# Patient Record
Sex: Male | Born: 1955 | Race: Black or African American | Hispanic: No | Marital: Single | Smoking: Never smoker
Health system: Southern US, Community
[De-identification: ages and names within clinical notes are randomized; demographics above are authoritative.]

## PROBLEM LIST (undated history)

## (undated) DIAGNOSIS — I10 Essential (primary) hypertension: Secondary | ICD-10-CM

---

## 2019-06-16 ENCOUNTER — Emergency Department (HOSPITAL_COMMUNITY)
Admission: EM | Admit: 2019-06-16 | Discharge: 2019-06-16 | Disposition: A | Discharge status: Home / Self Care | Payer: Medicare PPO | Attending: Emergency Medicine | Admitting: Emergency Medicine

## 2019-06-16 ENCOUNTER — Other Ambulatory Visit: Payer: Self-pay

## 2019-06-16 ENCOUNTER — Encounter (HOSPITAL_COMMUNITY): Payer: Self-pay | Admitting: Emergency Medicine

## 2019-06-16 DIAGNOSIS — R040 Epistaxis: Secondary | ICD-10-CM

## 2019-06-16 DIAGNOSIS — I10 Essential (primary) hypertension: Secondary | ICD-10-CM | POA: Diagnosis not present

## 2019-06-16 HISTORY — DX: Essential (primary) hypertension: I10

## 2019-06-16 LAB — CBC WITH DIFFERENTIAL/PLATELET
Abs Immature Granulocytes: 0.02 10*3/uL (ref 0.00–0.07)
Basophils Absolute: 0 10*3/uL (ref 0.0–0.1)
Basophils Relative: 1 %
Eosinophils Absolute: 0.2 10*3/uL (ref 0.0–0.5)
Eosinophils Relative: 3 %
HCT: 46.8 % (ref 39.0–52.0)
Hemoglobin: 15.7 g/dL (ref 13.0–17.0)
Immature Granulocytes: 0 %
Lymphocytes Relative: 28 %
Lymphs Abs: 1.7 10*3/uL (ref 0.7–4.0)
MCH: 31.3 pg (ref 26.0–34.0)
MCHC: 33.5 g/dL (ref 30.0–36.0)
MCV: 93.2 fL (ref 80.0–100.0)
Monocytes Absolute: 0.9 10*3/uL (ref 0.1–1.0)
Monocytes Relative: 14 %
Neutro Abs: 3.3 10*3/uL (ref 1.7–7.7)
Neutrophils Relative %: 54 %
Platelets: 154 10*3/uL (ref 150–400)
RBC: 5.02 MIL/uL (ref 4.22–5.81)
RDW: 13.2 % (ref 11.5–15.5)
WBC: 6.1 10*3/uL (ref 4.0–10.5)
nRBC: 0 % (ref 0.0–0.2)

## 2019-06-16 LAB — BASIC METABOLIC PANEL
Anion gap: 9 (ref 5–15)
BUN: 22 mg/dL (ref 8–23)
CO2: 21 mmol/L — ABNORMAL LOW (ref 22–32)
Calcium: 9.1 mg/dL (ref 8.9–10.3)
Chloride: 108 mmol/L (ref 98–111)
Creatinine, Ser: 1.14 mg/dL (ref 0.61–1.24)
GFR calc Af Amer: >60 mL/min (ref >60)
GFR calc non Af Amer: >60 mL/min (ref >60)
Glucose, Bld: 115 mg/dL — ABNORMAL HIGH (ref 70–99)
Potassium: 4.9 mmol/L (ref 3.5–5.1)
Sodium: 138 mmol/L (ref 135–145)

## 2019-06-16 LAB — PROTIME-INR
INR: 1.1 (ref 0.8–1.2)
Prothrombin Time: 14.5 seconds (ref 11.4–15.2)

## 2019-06-16 MED ORDER — SILVER NITRATE-POT NITRATE 75-25 % EX MISC
1.0000 "application " | Freq: Once | CUTANEOUS | Status: AC
  Administered 2019-06-16: 1 via TOPICAL
  Filled 2019-06-16: qty 1

## 2019-06-16 MED ORDER — CEPHALEXIN 500 MG PO CAPS: 500.0000 mg | ORAL_CAPSULE | Freq: Four times a day (QID) | ORAL | 0 refills | Status: AC

## 2019-06-16 MED ORDER — LIDOCAINE VISCOUS HCL 2 % MT SOLN
15.0000 mL | Freq: Once | OROMUCOSAL | Status: AC
  Administered 2019-06-16: 15 mL via OROMUCOSAL
  Filled 2019-06-16: qty 15

## 2019-06-16 MED ORDER — LIDOCAINE HCL (CARDIAC) PF 100 MG/5ML IV SOSY
PREFILLED_SYRINGE | INTRAVENOUS | Status: AC
  Filled 2019-06-16: qty 5

## 2019-06-16 MED ORDER — HYDROCODONE-ACETAMINOPHEN 5-325 MG PO TABS: 1.0000 | ORAL_TABLET | ORAL | 0 refills | Status: AC | PRN

## 2019-06-16 MED ORDER — LIDOCAINE HCL (PF) 1 % IJ SOLN
INTRAMUSCULAR | Status: AC
  Filled 2019-06-16: qty 5

## 2019-06-16 MED ORDER — SALINE SPRAY 0.65 % NA SOLN
1.0000 | Freq: Once | NASAL | Status: AC
  Administered 2019-06-16: 1 via NASAL
  Filled 2019-06-16: qty 44

## 2019-06-16 MED ORDER — OXYMETAZOLINE HCL 0.05 % NA SOLN
1.0000 | Freq: Once | NASAL | Status: AC
  Administered 2019-06-16: 1 via NASAL
  Filled 2019-06-16: qty 30

## 2019-06-16 NOTE — ED Provider Notes (Addendum)
[facility] EMERGENCY DEPARTMENT Provider Note   CSN: [id number] Arrival date & time: 06/16/19  1048     History   Chief Complaint Chief Complaint  Patient presents with  . Epistaxis    HPI Charles Lane is a 63 y.o. male.     Pt presents to the ED today with bleeding out of his right nostril.  The pt said he woke up from sleep with the bleeding.  He has never had this happen in the past.  The pt said he is not on blood thinners, just a baby ASA.  He denies any trauma.  He initially went to [facility] who sent him here.     Past Medical History:  Diagnosis Date  . Hypertension     There are no active problems to display for this patient.      Home Medications    Prior to Admission medications   Medication Sig Start Date End Date Taking? Authorizing Provider  cephALEXin (KEFLEX) 500 MG capsule Take 1 capsule (500 mg total) by mouth 4 (four) times daily. 06/16/19   Isla Pence, MD  HYDROcodone-acetaminophen (NORCO/VICODIN) 5-325 MG tablet Take 1 tablet by mouth every 4 (four) hours as needed. 06/16/19   Isla Pence, MD    Family History No family history on file.  Social History Social History   Tobacco Use  . Smoking status: Never Smoker  . Smokeless tobacco: Never Used  Substance Use Topics  . Alcohol use: Not Currently  . Drug use: Never     Allergies   Patient has no known allergies.   Review of Systems Review of Systems  HENT: Positive for nosebleeds.   All other systems reviewed and are negative.    Physical Exam Updated Vital Signs BP 118/90   Pulse 83   Temp 97.6 F (36.4 C) (Axillary)   Resp 17   SpO2 97%   Physical Exam Vitals signs and nursing note reviewed.  Constitutional:      Appearance: Normal appearance.  HENT:     Head: Normocephalic and atraumatic.     Right Ear: External ear normal.     Left Ear: External ear normal.     Nose:     Right Nostril: Epistaxis present.     Mouth/Throat:     Mouth:  Mucous membranes are moist.     Pharynx: Oropharynx is clear.  Eyes:     Extraocular Movements: Extraocular movements intact.     Conjunctiva/sclera: Conjunctivae normal.     Pupils: Pupils are equal, round, and reactive to light.  Neck:     Musculoskeletal: Normal range of motion and neck supple.  Cardiovascular:     Rate and Rhythm: Normal rate and regular rhythm.     Pulses: Normal pulses.     Heart sounds: Normal heart sounds.  Pulmonary:     Effort: Pulmonary effort is normal.     Breath sounds: Normal breath sounds.  Abdominal:     General: Abdomen is flat. Bowel sounds are normal.     Palpations: Abdomen is soft.  Musculoskeletal: Normal range of motion.  Skin:    General: Skin is warm.     Capillary Refill: Capillary refill takes less than 2 seconds.  Neurological:     General: No focal deficit present.     Mental Status: He is alert and oriented to person, place, and time.  Psychiatric:        Mood and Affect: Mood normal.  Behavior: Behavior normal.      ED Treatments / Results  Labs (all labs ordered are listed, but only abnormal results are displayed) Labs Reviewed  BASIC METABOLIC PANEL - Abnormal; Notable for the following components:      Result Value   CO2 21 (*)    Glucose, Bld 115 (*)    All other components within normal limits  CBC WITH DIFFERENTIAL/PLATELET  PROTIME-INR    EKG None  Radiology No results found.  Procedures .Epistaxis Management  Date/Time: 06/16/2019 11:50 AM Performed by: Jacalyn LefevreHaviland, Jamison Soward, MD Authorized by: Jacalyn LefevreHaviland, Ricki Clack, MD   Consent:    Consent obtained:  Verbal   Consent given by:  Patient   Risks discussed:  Bleeding   Alternatives discussed:  No treatment Anesthesia (see MAR for exact dosages):    Anesthesia method:  None Procedure details:    Treatment site:  R anterior   Treatment method:  Silver nitrate   Treatment complexity:  Limited   Treatment episode: recurring   Post-procedure details:     Assessment:  No improvement   Patient tolerance of procedure:  Tolerated well, no immediate complications .Epistaxis Management  Date/Time: 06/16/2019 11:50 AM Performed by: Jacalyn LefevreHaviland, Domenick Quebedeaux, MD Authorized by: Jacalyn LefevreHaviland, Donicia Druck, MD   Consent:    Consent obtained:  Verbal   Consent given by:  Patient   Risks discussed:  Bleeding   Alternatives discussed:  No treatment Anesthesia (see MAR for exact dosages):    Anesthesia method:  Topical application   Topical anesthetic:  Lidocaine gel Procedure details:    Treatment site:  R anterior   Treatment method:  Nasal balloon   Treatment complexity:  Limited   Treatment episode: recurring   Post-procedure details:    Assessment:  Bleeding stopped   Patient tolerance of procedure:  Tolerated well, no immediate complications Comments:     7.5 cm rhino rocket with balloon placed   (including critical care time)  Medications Ordered in ED Medications  sodium chloride (OCEAN) 0.65 % nasal spray 1 spray (has no administration in time range)  oxymetazoline (AFRIN) 0.05 % nasal spray 1 spray (1 spray Each Nare Given 06/16/19 1111)  silver nitrate applicators applicator 1 application (1 application Topical Given 06/16/19 1111)  lidocaine (XYLOCAINE) 2 % viscous mouth solution 15 mL (15 mLs Mouth/Throat Given 06/16/19 1118)     Initial Impression / Assessment and Plan / ED Course  I have reviewed the triage vital signs and the nursing notes.  Pertinent labs & imaging results that were available during my care of the patient were reviewed by me and considered in my medical decision making (see chart for details).       Pt is able to ambulate without his nose bleeding again.  He will be d/c home with the rhino rocket in place.  He is to f/u with ENT.  Return if worse.  As pt was walking out, his nose started bleeding again.  1 cc of air placed into balloon and bleeding stopped.  Pt is now stable for d/c.  Final Clinical Impressions(s) / ED  Diagnoses   Final diagnoses:  Epistaxis    ED Discharge Orders         Ordered    cephALEXin (KEFLEX) 500 MG capsule  4 times daily     06/16/19 1242    HYDROcodone-acetaminophen (NORCO/VICODIN) 5-325 MG tablet  Every 4 hours PRN     06/16/19 1242           Marguita Venning,  Raynelle Fanning, MD 06/16/19 1255    Jacalyn Lefevre, MD 06/16/19 1306

## 2019-06-16 NOTE — ED Notes (Signed)
Pt given dc instructions pt verbalizes understanding.  

## 2019-06-16 NOTE — ED Triage Notes (Signed)
Pt here with nose bleed that started abt 630 and it would bleed and stop bleed and stop. Pt takes a low dose aspirin. [facility] and [facility] suggested he come here to be seen,.

## 2020-04-22 ENCOUNTER — Emergency Department (HOSPITAL_COMMUNITY): Payer: Medicare PPO

## 2020-04-22 ENCOUNTER — Encounter (HOSPITAL_COMMUNITY): Payer: Self-pay

## 2020-04-22 ENCOUNTER — Emergency Department (HOSPITAL_COMMUNITY)
Admission: EM | Admit: 2020-04-22 | Discharge: 2020-04-22 | Disposition: A | Discharge status: Home / Self Care | Payer: Medicare PPO | Attending: Emergency Medicine | Admitting: Emergency Medicine

## 2020-04-22 ENCOUNTER — Other Ambulatory Visit: Payer: Self-pay

## 2020-04-22 DIAGNOSIS — R079 Chest pain, unspecified: Secondary | ICD-10-CM | POA: Insufficient documentation

## 2020-04-22 DIAGNOSIS — Y939 Activity, unspecified: Secondary | ICD-10-CM | POA: Insufficient documentation

## 2020-04-22 DIAGNOSIS — M545 Low back pain: Secondary | ICD-10-CM | POA: Diagnosis not present

## 2020-04-22 DIAGNOSIS — Z79899 Other long term (current) drug therapy: Secondary | ICD-10-CM | POA: Diagnosis not present

## 2020-04-22 DIAGNOSIS — Y999 Unspecified external cause status: Secondary | ICD-10-CM | POA: Insufficient documentation

## 2020-04-22 DIAGNOSIS — S39012A Strain of muscle, fascia and tendon of lower back, initial encounter: Secondary | ICD-10-CM

## 2020-04-22 DIAGNOSIS — Y92410 Unspecified street and highway as the place of occurrence of the external cause: Secondary | ICD-10-CM | POA: Insufficient documentation

## 2020-04-22 DIAGNOSIS — R519 Headache, unspecified: Secondary | ICD-10-CM | POA: Diagnosis not present

## 2020-04-22 LAB — BASIC METABOLIC PANEL
Anion gap: 12 (ref 5–15)
BUN: 13 mg/dL (ref 8–23)
CO2: 23 mmol/L (ref 22–32)
Calcium: 9.1 mg/dL (ref 8.9–10.3)
Chloride: 104 mmol/L (ref 98–111)
Creatinine, Ser: 1.17 mg/dL (ref 0.61–1.24)
GFR calc Af Amer: >60 mL/min (ref >60)
GFR calc non Af Amer: >60 mL/min (ref >60)
Glucose, Bld: 92 mg/dL (ref 70–99)
Potassium: 3.8 mmol/L (ref 3.5–5.1)
Sodium: 139 mmol/L (ref 135–145)

## 2020-04-22 LAB — TROPONIN I (HIGH SENSITIVITY): Troponin I (High Sensitivity): 3 ng/L (ref <18)

## 2020-04-22 LAB — CBC
HCT: 49.7 % (ref 39.0–52.0)
Hemoglobin: 16.4 g/dL (ref 13.0–17.0)
MCH: 30.5 pg (ref 26.0–34.0)
MCHC: 33 g/dL (ref 30.0–36.0)
MCV: 92.4 fL (ref 80.0–100.0)
Platelets: 168 10*3/uL (ref 150–400)
RBC: 5.38 MIL/uL (ref 4.22–5.81)
RDW: 13.2 % (ref 11.5–15.5)
WBC: 5.2 10*3/uL (ref 4.0–10.5)
nRBC: 0 % (ref 0.0–0.2)

## 2020-04-22 MED ORDER — CYCLOBENZAPRINE HCL 5 MG PO TABS: 5.0000 mg | ORAL_TABLET | Freq: Three times a day (TID) | ORAL | 0 refills | Status: AC | PRN

## 2020-04-22 MED ORDER — HYDROCODONE-ACETAMINOPHEN 5-325 MG PO TABS
2.0000 | ORAL_TABLET | Freq: Once | ORAL | Status: AC
  Administered 2020-04-22: 2 via ORAL
  Filled 2020-04-22: qty 2
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
## 2020-04-22 NOTE — Discharge Instructions (Signed)
Take motrin 800 mg every 6 hrs for pain   Your x-rays and CAT scans and labs were unremarkable.  I think you likely have some muscle spasms which will take another week or two to improve.  See your doctor for follow-up.  Return to ER if you have worse back pain, unable to walk, chest pain.
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
## 2020-06-13 ENCOUNTER — Other Ambulatory Visit: Payer: Self-pay

## 2020-06-13 ENCOUNTER — Ambulatory Visit: Payer: Medicare PPO | Attending: Family Medicine | Admitting: Physical Therapy

## 2020-06-13 ENCOUNTER — Encounter: Payer: Self-pay | Admitting: Physical Therapy

## 2020-06-13 DIAGNOSIS — G8929 Other chronic pain: Secondary | ICD-10-CM | POA: Insufficient documentation

## 2020-06-13 DIAGNOSIS — M6281 Muscle weakness (generalized): Secondary | ICD-10-CM

## 2020-06-13 DIAGNOSIS — R2689 Other abnormalities of gait and mobility: Secondary | ICD-10-CM | POA: Diagnosis present

## 2020-06-13 DIAGNOSIS — M545 Low back pain, unspecified: Secondary | ICD-10-CM | POA: Diagnosis not present
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
## 2020-06-13 NOTE — Patient Instructions (Signed)
Access Code: [facility] URL: [email or web address] Date: 06/13/2020 Prepared by: Rosana Hoes  Exercises Supine Lower Trunk Rotation - 2 x daily - 5 reps - 10 seconds hold Hooklying Single Knee to Chest Stretch - 2 x daily - 3 reps - 30 seconds hold Quadruped Rocking Backward - 2 x daily - 5 reps - 10 seconds hold Standing Paraspinals Mobilization with Small Ball on Wall - 2 x daily - 2-5 minutes hold

## 2020-06-20 ENCOUNTER — Other Ambulatory Visit: Payer: Self-pay

## 2020-06-20 ENCOUNTER — Encounter: Payer: Self-pay | Admitting: Physical Therapy

## 2020-06-20 ENCOUNTER — Ambulatory Visit: Payer: Medicare PPO | Attending: Family Medicine | Admitting: Physical Therapy

## 2020-06-20 DIAGNOSIS — R2689 Other abnormalities of gait and mobility: Secondary | ICD-10-CM | POA: Diagnosis present

## 2020-06-20 DIAGNOSIS — M6281 Muscle weakness (generalized): Secondary | ICD-10-CM

## 2020-06-20 DIAGNOSIS — M545 Low back pain, unspecified: Secondary | ICD-10-CM | POA: Diagnosis not present

## 2020-06-20 DIAGNOSIS — G8929 Other chronic pain: Secondary | ICD-10-CM | POA: Insufficient documentation

## 2020-06-20 NOTE — Therapy (Signed)
[facility] [address] [place], [place], [address] Phone: [phone]   Fax:  [phone]  Physical Therapy Treatment  Patient Details  Name: Ersel Enslin MRN: [id number] Date of Birth: [birth year] Referring Provider (PT): Robyne Peers, MD   Encounter Date: 06/20/2020   PT End of Session - 06/20/20 0720    Visit Number 2    Number of Visits 6    Date for PT Re-Evaluation 07/25/20    Authorization Type [employer] MCR, Worker's Comp    Authorization Time Period FOTO by 6th visit    PT Start Time 0715    PT Stop Time 0805    PT Time Calculation (min) 50 min           Past Medical History:  Diagnosis Date  . Hypertension     History reviewed. No pertinent surgical history.  There were no vitals filed for this visit.   Subjective Assessment - 06/20/20 0719    Subjective pt reports tennis ball has been helpful. He reports some days are good and some days are bad.    Currently in Pain? Yes    Pain Score 3     Pain Location Back    Pain Orientation Lower;Left    Pain Descriptors / Indicators Tightness    Pain Type Chronic pain    Aggravating Factors  stand, walk, bend,    Pain Relieving Factors self massage with tennis ball                             [facility] Adult PT Treatment/Exercise - 06/20/20 0001      Lumbar Exercises: Stretches   Single Knee to Chest Stretch 3 reps;30 seconds    Lower Trunk Rotation 5 reps;10 seconds      Lumbar Exercises: Aerobic   Nustep L4 UE/LE x 5 minutes       Lumbar Exercises: Supine   Pelvic Tilt 10 reps    Pelvic Tilt Limitations tactile and verbal cues for techinque and breathing     Clam 20 reps   green band    Clam Limitations cues for ab set  and breathing     Bent Knee Raise 20 reps    Bent Knee Raise Limitations cues for ab set and breathing     Bridge 10 reps    Bridge Limitations increased pain      Modalities   Modalities Moist Heat      Moist  Heat Therapy   Number Minutes Moist Heat 10 Minutes    Moist Heat Location Lumbar Spine   left lumbar in sidelying     Manual Therapy   Myofascial Release Left lumbar paraspinals while in sidelying                   PT Education - 06/20/20 0758    Education Details HEP, FOTO score discussed    Person(s) Educated Patient    Methods Explanation;Handout    Comprehension Verbalized understanding            PT Short Term Goals - 06/13/20 0934      PT SHORT TERM GOAL #1   Title Patient will be I with initial HEP to progress with PT    Time 3    Period Weeks    Status New    Target Date 07/04/20      PT SHORT TERM GOAL #2   Title Patient will  report improved resting pain level to </= 3/10 to reduce functional limitation    Time 3    Period Weeks    Status New    Target Date 07/04/20             PT Long Term Goals - 06/13/20 0935      PT LONG TERM GOAL #1   Title Patient will be I with final HEP to maintain progress from PT    Time 6    Period Weeks    Status New    Target Date 07/25/20      PT LONG TERM GOAL #2   Title Patient will report improved functional status to >/= 54% on FOTO    Baseline 34% functional status    Time 6    Period Weeks    Status New    Target Date 07/25/20      PT LONG TERM GOAL #3   Title Patient will demonstrate lumbar AROM grossly WFL to improve dressing    Time 6    Period Weeks    Status New    Target Date 07/25/20      PT LONG TERM GOAL #4   Title Patient will be able to sit and walk >/= 1 hour without limitation to improve working ability    Time 6    Period Weeks    Status New    Target Date 07/25/20      PT LONG TERM GOAL #5   Title Patient will demonstrtae improved gross core/hip strength >/= 4/5 MMT in order to improve lifting ability    Time 6    Period Weeks    Status New    Target Date 07/25/20                 Plan - 06/20/20 0755    Clinical Impression Statement Pt arrives reporting  decreased pain he attributes to using tennis ball for self massage. Reviewed HEP and progressed with lumbar stabilization. Some jerky LE motions noted with marching and clams. Updated HEP. Manual STW performed to right lumbar paraspinals. Pt demonstrated softened tissue and decreased muscle guarding after STW. HMP applied to further decrease tension and to assess benefit for patient awareness. He is currently not using any heat or ice at home to manage pain.    PT Next Visit Plan 6th visit FOTO,  review HEP and progress PRN, manual/dry needling and modalities PRN to reduce low back pain and tightness, lumbar stretching, initiate core/hip strengthening    PT Home Exercise Plan [email or web address]: SKTC, LTR, quadruped rock back, SMFR using tennis ball, added supine march and clam with green band           Patient will benefit from skilled therapeutic intervention in order to improve the following deficits and impairments:  Abnormal gait, Decreased range of motion, Difficulty walking, Decreased activity tolerance, Pain, Impaired flexibility, Improper body mechanics, Decreased strength, Postural dysfunction  Visit Diagnosis: Chronic left-sided low back pain, unspecified whether sciatica present  Muscle weakness (generalized)  Other abnormalities of gait and mobility     Problem List There are no problems to display for this patient.   Jannette Spanner [place], [place] 06/20/2020, 8:27 AM  [facility] [address] [place], [place], [address] Phone: [phone]   Fax:  [phone]  Name: Finlee Milo MRN: [id number] Date of Birth: [birth year]

## 2020-06-24 ENCOUNTER — Other Ambulatory Visit: Payer: Self-pay

## 2020-06-24 ENCOUNTER — Ambulatory Visit: Payer: Medicare PPO

## 2020-06-24 DIAGNOSIS — M545 Low back pain, unspecified: Secondary | ICD-10-CM | POA: Diagnosis not present

## 2020-06-24 DIAGNOSIS — M6281 Muscle weakness (generalized): Secondary | ICD-10-CM

## 2020-06-24 DIAGNOSIS — R2689 Other abnormalities of gait and mobility: Secondary | ICD-10-CM

## 2020-06-24 DIAGNOSIS — G8929 Other chronic pain: Secondary | ICD-10-CM

## 2020-06-24 NOTE — Therapy (Signed)
[facility] [address] [place], [place], [address] Phone: [phone]   Fax:  [phone]  Physical Therapy Treatment  Patient Details  Name: Charles Lane MRN: [id number] Date of Birth: 1956/05/12 Referring Provider (PT): Robyne Peers, MD   Encounter Date: 06/24/2020   PT End of Session - 06/24/20 0807    Visit Number 3    Number of Visits 6    Date for PT Re-Evaluation 07/25/20    Authorization Type [employer] MCR, Worker's Comp    Authorization Time Period FOTO by 6th visit    PT Start Time 0803    PT Stop Time 0845    PT Time Calculation (min) 42 min    Activity Tolerance Patient tolerated treatment well    Behavior During Therapy [facility] for tasks assessed/performed           Past Medical History:  Diagnosis Date  . Hypertension     No past surgical history on file.  There were no vitals filed for this visit.   Subjective Assessment - 06/24/20 0806    Subjective Pt reports he is feeling okay today. He's been doing his exercises.    Currently in Pain? No/denies                             [facility] Adult PT Treatment/Exercise - 06/24/20 0001      Lumbar Exercises: Stretches   Single Knee to Chest Stretch 3 reps;30 seconds    Lower Trunk Rotation 5 reps;10 seconds    Lower Trunk Rotation Limitations progressed to figure 4 with same side lumbar rot'n first   difficulty rotating L due to low back tightness     Lumbar Exercises: Aerobic   Nustep L5 UE/LE x 5 minutes       Lumbar Exercises: Supine   Pelvic Tilt 10 reps    Pelvic Tilt Limitations tactile and verbal cues for techinque and breathing    difficulty with posterior tilt; added ball ADD   Bent Knee Raise 20 reps    Bent Knee Raise Limitations cues for ab set and breathing (inhale in, exhale out) slowly    Bridge with [employer] 10 reps;5 seconds    Bridge with [employer] Limitations very low to maintain PPT with ball ADD       Lumbar Exercises: Sidelying   Clam Both;20 reps;2 seconds    Clam Limitations GTB, focus on PPT and exhale to lift                    PT Short Term Goals - 06/13/20 0934      PT SHORT TERM GOAL #1   Title Patient will be I with initial HEP to progress with PT    Time 3    Period Weeks    Status New    Target Date 07/04/20      PT SHORT TERM GOAL #2   Title Patient will report improved resting pain level to </= 3/10 to reduce functional limitation    Time 3    Period Weeks    Status New    Target Date 07/04/20             PT Long Term Goals - 06/13/20 0935      PT LONG TERM GOAL #1   Title Patient will be I with final HEP to maintain progress from PT    Time 6  Period Weeks    Status New    Target Date 07/25/20      PT LONG TERM GOAL #2   Title Patient will report improved functional status to >/= 54% on FOTO    Baseline 34% functional status    Time 6    Period Weeks    Status New    Target Date 07/25/20      PT LONG TERM GOAL #3   Title Patient will demonstrate lumbar AROM grossly WFL to improve dressing    Time 6    Period Weeks    Status New    Target Date 07/25/20      PT LONG TERM GOAL #4   Title Patient will be able to sit and walk >/= 1 hour without limitation to improve working ability    Time 6    Period Weeks    Status New    Target Date 07/25/20      PT LONG TERM GOAL #5   Title Patient will demonstrtae improved gross core/hip strength >/= 4/5 MMT in order to improve lifting ability    Time 6    Period Weeks    Status New    Target Date 07/25/20                 Plan - 06/24/20 0807    Clinical Impression Statement Pt presents with continued improvement in LBP and minimal soft tissue tension or MFR noted in left low back with palpation during S/L clams. Pt has poor lumbopelvic rhythm with difficulty performing PPT and coordinating breathing. Tactile feedback was helpful for pt to imprint low back to mat, with ability to  perform very low bridge and no back pain. Pt educated on importance of lumbopelvic rhythm for decrease in low back pain and improving core strength. Additionally, educated on weakness in L glute med with S/L clams and need to move slower and through smaller ROM to strengthen stabilizers .    PT Next Visit Plan 6th visit FOTO,  review HEP and progress PRN, manual/dry needling and modalities PRN to reduce low back pain and tightness, lumbar stretching, progress core/hip strengthening and lumbopelvic rhythm    PT Home Exercise Plan [email or web address]: SKTC, LTR, quadruped rock back, SMFR using tennis ball, added supine march and clam with green band    Consulted and Agree with Plan of Care Patient           Patient will benefit from skilled therapeutic intervention in order to improve the following deficits and impairments:  Abnormal gait, Decreased range of motion, Difficulty walking, Decreased activity tolerance, Pain, Impaired flexibility, Improper body mechanics, Decreased strength, Postural dysfunction  Visit Diagnosis: Chronic left-sided low back pain, unspecified whether sciatica present  Muscle weakness (generalized)  Other abnormalities of gait and mobility     Problem List There are no problems to display for this patient.   Marcelline Mates, PT, DPT 06/24/2020, 8:55 AM  [facility] [address] [place], [place], [address] Phone: [phone]   Fax:  [phone]  Name: Charles Lane MRN: [id number] Date of Birth: September 02, 1955

## 2020-07-01 ENCOUNTER — Ambulatory Visit: Payer: Medicare PPO | Attending: Family Medicine | Admitting: Physical Therapy

## 2020-07-01 ENCOUNTER — Telehealth: Payer: Self-pay | Admitting: Physical Therapy

## 2020-07-01 DIAGNOSIS — M545 Low back pain, unspecified: Secondary | ICD-10-CM | POA: Insufficient documentation

## 2020-07-01 DIAGNOSIS — G8929 Other chronic pain: Secondary | ICD-10-CM | POA: Insufficient documentation

## 2020-07-01 DIAGNOSIS — M6281 Muscle weakness (generalized): Secondary | ICD-10-CM | POA: Insufficient documentation

## 2020-07-01 DIAGNOSIS — R2689 Other abnormalities of gait and mobility: Secondary | ICD-10-CM | POA: Insufficient documentation

## 2020-07-01 NOTE — Telephone Encounter (Signed)
Attempted to contact patient in regard to missed PT appointment. Left VM for patient informing him of the missed appointment and reminder of next scheduled appointment on 07/08/2020 at 10:45am. Patient informed of attendance policy and encouraged to contact office if he needs to reschedule or cancel next appointment.   Rosana Hoes, PT, DPT, LAT, ATC 07/01/20  1:51 PM Phone: [phone] Fax: [phone]

## 2020-07-08 ENCOUNTER — Other Ambulatory Visit: Payer: Self-pay

## 2020-07-08 ENCOUNTER — Encounter: Payer: Self-pay | Admitting: Physical Therapy

## 2020-07-08 ENCOUNTER — Ambulatory Visit: Payer: Medicare PPO | Admitting: Physical Therapy

## 2020-07-08 DIAGNOSIS — R2689 Other abnormalities of gait and mobility: Secondary | ICD-10-CM

## 2020-07-08 DIAGNOSIS — M545 Low back pain, unspecified: Secondary | ICD-10-CM | POA: Diagnosis not present

## 2020-07-08 DIAGNOSIS — G8929 Other chronic pain: Secondary | ICD-10-CM

## 2020-07-08 DIAGNOSIS — M6281 Muscle weakness (generalized): Secondary | ICD-10-CM | POA: Diagnosis present

## 2020-07-08 NOTE — Therapy (Addendum)
[place], [place], [address] Phone: [phone]   Fax:  [phone]  Physical Therapy Treatment / Discharge  Patient Details  Name: Charles Lane MRN: [id number] Date of Birth: [birth year] Referring Provider (PT): Jearld Shines, MD   Encounter Date: 07/08/2020   PT End of Session - 07/08/20 1120    Visit Number 4    Number of Visits 6    Date for PT Re-Evaluation 07/25/20    Authorization Type [employer] M[employer], Worker's Comp    Authorization Time Period FOTO by 6th visit    Progress Note Due on Visit 10    PT Start Time 1050    PT Stop Time 1130    PT Time Calculation (min) 40 min    Activity Tolerance Patient tolerated treatment well    Behavior During Therapy [facility] for tasks assessed/performed           Past Medical History:  Diagnosis Date  . Hypertension     History reviewed. No pertinent surgical history.  There were no vitals filed for this visit.   Subjective Assessment - 07/08/20 1057    Subjective Patient reports he if feel good today. He was sick last week and thats why he missed his appointment. He notes his exercises are going well and are helping.    Patient Stated Goals Get back feeling better so he can walk better and work    Currently in Pain? No/denies              [facility] PT Assessment - 07/08/20 0001      AROM   Overall AROM Comments Patient report left lower back discomfort with extension and slightly with left side bend    Lumbar Flexion WFL    Lumbar Extension 75%    Lumbar - Right Side Bend WFL    Lumbar - Left Side Bend WFL    Lumbar - Right Rotation 75%    Lumbar - Left Rotation 75%      PROM   Overall PROM Comments Hip PROM grossly WFL      Strength   Overall Strength Comments Core strength grossly 4-/5 MMT    Right Hip Extension 4-/5    Right Hip ABduction 4-/5    Left Hip Extension 4-/5    Left Hip ABduction 4-/5                          [facility] Adult PT Treatment/Exercise - 07/08/20 0001      Lumbar Exercises: Stretches   Single Knee to Chest Stretch 30 seconds    Lower Trunk Rotation 3 reps;10 seconds    Lower Trunk Rotation Limitations figure-4    Piriformis Stretch 2 reps;30 seconds    Other Lumbar Stretch Exercise Quadruped rock back into childs pose stretch 5 x 10 sec      Lumbar Exercises: Aerobic   Nustep L5 x 4 minutes  UE/LE       Lumbar Exercises: Seated   Sit to Stand 10 reps   2 sets     Lumbar Exercises: Supine   Clam 10 reps;3 seconds   2 sets   Clam Limitations blue band    Bent Knee Raise 10 reps;3 seconds   2 sets   Bent Knee Raise Limitations blue band    Bridge with Ball Squeeze 5 reps;5 seconds   2 sets   Bridge with [employer] Limitations cued for PPT, partial  range                  PT Education - 07/08/20 1058    Education Details HEP update    Person(s) Educated Patient    Methods Explanation;Demonstration;Verbal cues;Handout    Comprehension Verbalized understanding;Returned demonstration;Verbal cues required;Need further instruction            PT Short Term Goals - 07/08/20 1218      PT SHORT TERM GOAL #1   Title Patient will be I with initial HEP to progress with PT    Time 3    Period Weeks    Status On-going    Target Date 07/04/20      PT SHORT TERM GOAL #2   Title Patient will report improved resting pain level to </= 3/10 to reduce functional limitation    Baseline Patient denies resting pain    Time 3    Period Weeks    Status Achieved    Target Date 07/04/20             PT Long Term Goals - 06/13/20 0935      PT LONG TERM GOAL #1   Title Patient will be I with final HEP to maintain progress from PT    Time 6    Period Weeks    Status New    Target Date 07/25/20      PT LONG TERM GOAL #2   Title Patient will report improved functional status to >/= 54% on FOTO    Baseline 34% functional status    Time 6    Period Weeks    Status New     Target Date 07/25/20      PT LONG TERM GOAL #3   Title Patient will demonstrate lumbar AROM grossly WFL to improve dressing    Time 6    Period Weeks    Status New    Target Date 07/25/20      PT LONG TERM GOAL #4   Title Patient will be able to sit and walk >/= 1 hour without limitation to improve working ability    Time 6    Period Weeks    Status New    Target Date 07/25/20      PT LONG TERM GOAL #5   Title Patient will demonstrtae improved gross core/hip strength >/= 4/5 MMT in order to improve lifting ability    Time 6    Period Weeks    Status New    Target Date 07/25/20                 Plan - 07/08/20 1122    Clinical Impression Statement Patient tolerated therapy well with no adverse effects. Patient demonstrates improvement in his lumbar motion and reports reduce pain, but continues to exhibits gross core and hip strength deficits with poor lumbopelvic control. He does note continued left lower back pain with mainly extension based movement at this point. His HEP was progressed this visit to incorporate further core and hip strengthening. Patient required consistent cueing for technique and core control with therapy. He would benefit from continued skilled PT to progress mobility and strength to reduce pain and maximize functional level.    PT Treatment/Interventions ADLs/Self Care Home Management;Cryotherapy;Electrical Stimulation;Iontophoresis 30m/ml Dexamethasone;Moist Heat;Traction;Ultrasound;Neuromuscular re-education;Therapeutic exercise;Therapeutic activities;Functional mobility training;Stair training;Gait training;Patient/family education;Manual techniques;Dry needling;Passive range of motion;Taping;Spinal Manipulations;Joint Manipulations    PT Next Visit Plan 6th visit FOTO, review HEP and progress PRN, manual/dry needling and modalities  PRN to reduce low back pain and tightness, lumbar stretching, progress core/hip strengthening and lumbopelvic rhythm    PT  Home Exercise Plan [id number]: SKTC, LTR, quadruped rock back, SMFR using tennis ball, supine march with blue, supine clam with green blue, bridge, sit<>stand    Consulted and Agree with Plan of Care Patient           Patient will benefit from skilled therapeutic intervention in order to improve the following deficits and impairments:  Abnormal gait, Decreased range of motion, Difficulty walking, Decreased activity tolerance, Pain, Impaired flexibility, Improper body mechanics, Decreased strength, Postural dysfunction  Visit Diagnosis: Chronic left-sided low back pain, unspecified whether sciatica present  Muscle weakness (generalized)  Other abnormalities of gait and mobility     Problem List There are no problems to display for this patient.   Hilda Blades, PT, DPT, LAT, ATC 07/08/20  12:21 PM Phone: [phone] Fax: [facility] [address] [place], [place], [address] Phone: [phone]   Fax:  [phone]  Name: Charles Lane MRN: [id number] Date of Birth: 10-06-1955    PHYSICAL THERAPY DISCHARGE SUMMARY  Visits from Start of Care: 4  Current functional level related to goals / functional outcomes: See above   Remaining deficits: See above   Education / Equipment: HEP  Plan:                                                    Patient goals were not met. Patient is being discharged due to not returning since the last visit.  ?????    Hilda Blades, PT, DPT, LAT, ATC 09/01/20  9:55 AM Phone: [phone] Fax: [phone]

## 2020-07-08 NOTE — Patient Instructions (Signed)
Access Code: [facility] URL: [email or web address] Date: 07/08/2020 Prepared by: Rosana Hoes  Exercises Supine Lower Trunk Rotation - 2 x daily - 5 reps - 10 seconds hold Hooklying Single Knee to Chest Stretch - 2 x daily - 3 reps - 30 seconds hold Supine Piriformis Stretch with Foot on Ground - 2 x daily - 3 reps - 30 seconds hold Quadruped Rocking Backward - 2 x daily - 5 reps - 10 seconds hold Standing Paraspinals Mobilization with Small Ball on Wall - 2 x daily - 2-5 minutes hold Supine March with Resistance Band - 1 x daily - 2 sets - 10 reps Hooklying Clamshell with Resistance - 1 x daily - 2 sets - 10 reps - 5 hold Supine Bridge - 1 x daily - 2 sets - 10 reps - 5 seconds hold Sit to Stand without Arm Support - 1 x daily - 2 sets - 10 reps

## 2020-08-11 ENCOUNTER — Ambulatory Visit: Payer: Medicare PPO | Admitting: Physical Therapy

## 2020-08-28 ENCOUNTER — Ambulatory Visit: Payer: Medicare PPO | Attending: Family Medicine | Admitting: Physical Therapy

## 2020-09-01 ENCOUNTER — Telehealth: Payer: Self-pay | Admitting: Physical Therapy

## 2020-09-01 NOTE — Telephone Encounter (Signed)
Attempted to contact patient due to missed PT appointment on 08/28/2020. Left VM for patient informing him that he has not been seen since 07/08/2020 and due to attendance policy patient informed he would be discharged from PT and would need new PT referral prior to returning.   Rosana Hoes, PT, DPT, LAT, ATC 09/01/20  9:52 AM Phone: [phone] Fax: [phone]
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
